# Patient Record
Sex: Male | Born: 1965 | Hispanic: No | Marital: Married | Smoking: Never smoker
Health system: Southern US, Community
[De-identification: ages and names within clinical notes are randomized; demographics above are authoritative.]

## PROBLEM LIST (undated history)

## (undated) DIAGNOSIS — K219 Gastro-esophageal reflux disease without esophagitis: Secondary | ICD-10-CM

## (undated) DIAGNOSIS — Z8619 Personal history of other infectious and parasitic diseases: Secondary | ICD-10-CM

## (undated) HISTORY — DX: Gastro-esophageal reflux disease without esophagitis: K21.9

## (undated) HISTORY — PX: ESOPHAGOGASTRODUODENOSCOPY: SHX1529

## (undated) HISTORY — DX: Personal history of other infectious and parasitic diseases: Z86.19

---

## 2003-06-06 ENCOUNTER — Emergency Department (HOSPITAL_COMMUNITY): Admission: EM | Admit: 2003-06-06 | Discharge: 2003-06-06 | Payer: Self-pay | Admitting: Emergency Medicine

## 2007-08-01 ENCOUNTER — Encounter: Admission: RE | Admit: 2007-08-01 | Discharge: 2007-08-01 | Payer: Self-pay | Admitting: Family Medicine

## 2007-08-30 ENCOUNTER — Ambulatory Visit: Payer: Self-pay | Admitting: Internal Medicine

## 2007-09-08 ENCOUNTER — Ambulatory Visit: Payer: Self-pay | Admitting: Internal Medicine

## 2007-09-08 ENCOUNTER — Encounter: Payer: Self-pay | Admitting: Internal Medicine

## 2007-10-10 ENCOUNTER — Telehealth: Payer: Self-pay | Admitting: Internal Medicine

## 2011-09-23 ENCOUNTER — Encounter: Payer: Self-pay | Admitting: Internal Medicine

## 2011-09-29 ENCOUNTER — Telehealth: Payer: Self-pay | Admitting: Internal Medicine

## 2011-09-29 ENCOUNTER — Ambulatory Visit (INDEPENDENT_AMBULATORY_CARE_PROVIDER_SITE_OTHER): Payer: BC Managed Care – PPO | Admitting: Internal Medicine

## 2011-09-29 ENCOUNTER — Encounter: Payer: Self-pay | Admitting: Internal Medicine

## 2011-09-29 VITALS — BP 124/72 | HR 68 | Ht 70.0 in | Wt 173.0 lb

## 2011-09-29 DIAGNOSIS — K219 Gastro-esophageal reflux disease without esophagitis: Secondary | ICD-10-CM

## 2011-09-29 NOTE — Patient Instructions (Addendum)
Please stay on your current regimen as it will take some time to get better.  Follow- up with [place] in six weeks, you may make that appointment on your way out.  Today you have been given a hand out on GERD and also correction of acid reflux by laparoscopy information.

## 2011-09-29 NOTE — Telephone Encounter (Signed)
Reflux and heartburn.  He will come in and see Dr. Leone Payor today at 11:00

## 2011-09-29 NOTE — Progress Notes (Signed)
Subjective:    Patient ID: Austin Clements, male    DOB: 12-09-1965, 46 y.o.   MRN: [id number]  HPI The patient presents because of her current acid reflux symptoms. 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 He was seen in 2009. At that time he had a normal esophagus but a gastritis that was H. pylori positive and he was treated with antibiotics. He seemed to improve. Over time he had recurrent heartburn and reflux symptoms though he managed with over-the-counter Prevacid or Prilosec. Things seem to worsen and he started taking Prevacid and Prilosec each morning. He noticed especially that he had problems with coffee or tea and red wine. Over the last month or so he said significantly increased symptoms of heartburn, including at night. He has reduced and/or eliminated the red wine and coffee repeat. He has moved to decaffeinated coffee but thinks that still bothersome. He was using 2-3 pillows to sleep at night to prevent nocturnal reflux. He has not had unintentional weight loss, dysphagia or bleeding. In the past week he saw the plaques physician, and was prescribed Protonix 40 mg twice a day, Carafate 1 g 4 times a day and a bland diet and over the past several days he is noted improvement. He does not have dysphagia but he does feel like his esophagus is raw or rough after eating.  No Known Allergies No outpatient prescriptions prior to visit.   Past Medical History  Diagnosis Date  . GERD (gastroesophageal reflux disease)   . History of Helicobacter pylori infection    Past Surgical History  Procedure Date  . Esophagogastroduodenoscopy 09-2007    H-pylori   History   Social History  . Marital Status: Married         Number of Children: 1  . Years of Education: PhD Chemist   Occupational History  . Scientist  [employer]   Social History Main Topics  . Smoking status: Never Smoker   . Smokeless tobacco: Never Used  . Alcohol Use: No  . Drug Use: No    Social History  Narrative   Daily caffeine    Family History  Problem Relation Age of Onset  . Colon cancer Neg Hx         Review of Systems As per history of present illness, all other review of systems negative    Objective:   Physical Exam General:  Well-developed, well-nourished and in no acute distress Eyes:  anicteric. ENT:   Mouth and posterior pharynx free of lesions.  Neck:   supple w/o thyromegaly or mass.  Lungs: Clear to auscultation bilaterally. Heart:  S1S2, no rubs, murmurs, gallops. Abdomen:  soft, non-tender, no hepatosplenomegaly, hernia, or mass and BS+.  Rectal: Lymph:  no cervical or supraclavicular adenopathy. Extremities:   no edema Skin   no rash. Neuro:  A&O x 3.  Psych:  appropriate mood and  Affect.           Assessment & Plan:   1. GERD (gastroesophageal reflux disease)    He is having a flare of acid reflux disease. It is possibly related to eradicate h pylori though he did well for a period of time after that it sounds like. He is modified his diet extensively. He is concerned about possible damage to the esophagus. I've explained it serious chronic damages unlikely given the normal looking esophagus in 2009. At this point I do not think endoscopy has any particular value as he seems to be improving.  I have recommended he continue to twice daily Protonix as well as the Carafate and see me in 6 weeks at which point we could reach group and modify his treatment regimen to determine if any investigative studies were needed. Handouts regarding acid reflux, laparoscopic fundoplication surgery for reflux, and Reflux diet are given to the patient today. Ultimately, reflux surgery is a possibility for this patient ON explained further testing with pH probe and manometry would be indicated prior to pursuing.  I appreciate the opportunity to care for this patient.   CC: [facility]

## 2011-09-30 ENCOUNTER — Telehealth: Payer: Self-pay

## 2011-09-30 NOTE — Telephone Encounter (Signed)
Office visit notes sent per Dr. Marvell Fuller request to pt's place of work, [employer], phone# [phone], fax# [phone].

## 2011-09-30 NOTE — Telephone Encounter (Signed)
Message copied by [place], Brigit Doke E on Wed Sep 30, 2011  8:45 AM ------      Message from: Iva Boop      Created: Tue Sep 29, 2011  5:27 PM      Regarding: cc to pcp       Please print and fax copy of encounter to [facility]

## 2011-10-15 ENCOUNTER — Ambulatory Visit: Payer: Self-pay | Admitting: Internal Medicine

## 2011-11-10 ENCOUNTER — Ambulatory Visit (INDEPENDENT_AMBULATORY_CARE_PROVIDER_SITE_OTHER): Payer: BC Managed Care – PPO | Admitting: Internal Medicine

## 2011-11-10 ENCOUNTER — Encounter: Payer: Self-pay | Admitting: Internal Medicine

## 2011-11-10 VITALS — BP 100/68 | HR 64 | Ht 69.25 in | Wt 171.4 lb

## 2011-11-10 DIAGNOSIS — F419 Anxiety disorder, unspecified: Secondary | ICD-10-CM

## 2011-11-10 DIAGNOSIS — K219 Gastro-esophageal reflux disease without esophagitis: Secondary | ICD-10-CM

## 2011-11-10 DIAGNOSIS — F411 Generalized anxiety disorder: Secondary | ICD-10-CM

## 2011-11-10 MED ORDER — SUCRALFATE 1 G PO TABS: 1.0000 g | ORAL_TABLET | Freq: Four times a day (QID) | ORAL | Status: DC

## 2011-11-10 NOTE — Patient Instructions (Addendum)
No allergy to soy or eggs.  We are giving you a handout on Barrett's Esophagus per your request.  Try and dissolve your Carafate pills in a liquid to make it easy to take.  We have sent a Rx for you to pick up at your pharmacy.  Continue to work on your diet and exercise to control your reflux symptoms.  Follow-up as needed with Dr. Leone Payor.  Thank you for choosing me and [facility].  Iva Boop, M.D., [facility]

## 2011-11-10 NOTE — Progress Notes (Signed)
Patient ID: Austin Clements, male   DOB: 1965/07/21, 46 y.o.   MRN: [id number]  The patient returns to discuss his reflux problems. He has noted that exercise and dietary restriction has helped quite a bit. He avoids tomato-based foods, caffeine chocolate. Just the twice daily course of Protonix. He is using Carafate slurry but notes that the slurries expensive and the tablets are cheaper. He says he has heartburn and sensitivity to swallowing these trigger foods with burning upon ingestion, off and on for many years, and that he has noted that stress and anxiety seem to make it worse. He reminded he is tried Prevacid, omeprazole, Protonix and I think Nexium as well and that is really did not make much difference over the years. He has nearly eliminated coffee although taking about 3 towards a cup a day. He has rare line, and he is limiting the other foods as mentioned.  He wonders about treating his stress, as he has a colleague who told him that Lexapro finally cured his heartburn problems. The patient is also concerned about the possibility of Barrett's esophagus, I reminded him that he did not have any in 2009.  Medications, allergies, past medical history, past surgical history, family history and social history are reviewed and updated in the EMR.   Assessment and plan:  1. GERD (gastroesophageal reflux disease)   2. Anxiety    He may really have more of a functional heartburn problem, and a visceral hypersensitivity problem. 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 and has some insomnia associated with this as well. The lack of response to PPI therapy goes against classic GERD though he could have weekly acidic her nonacidic reflux as Carafate does seem to help. I discussed options of treatment of the anxiety that would include buspirone, a tricyclic agent like amitriptyline and an SSRI.  1. Since he feels much better using an exercise and diet restriction  regimen, I've advised he continue that and consider these other therapies pending upon how that does. 2. Carafate tablets are prescribed, he should be able to dissolve these in water and make a slurry and save money that way. 3. He knows to call back if persistent problems and likely prescribe an anxiolytic type medication as described above or a tricyclic agent if desired.  I appreciate the opportunity to care for this patient.   CC: Charolett Bumpers, MD

## 2011-11-16 ENCOUNTER — Telehealth: Payer: Self-pay | Admitting: Internal Medicine

## 2011-11-16 NOTE — Telephone Encounter (Signed)
Patient advised I will discuss with Dr. Leone Payor when he returns to the office next week and we will call him with a response

## 2011-11-16 NOTE — Telephone Encounter (Signed)
Pt wants to try Aciphex because a friend said it worked well for them.  He is on Carafate and completed his Protonix.  Please advise since Dr. Shelle Iron on vacation this week.  Would like it sent to [facility] in [place] if ok'ed.  Thank you Sheri.

## 2011-11-18 MED ORDER — RABEPRAZOLE SODIUM 20 MG PO TBEC: DELAYED_RELEASE_TABLET | ORAL | Status: DC

## 2011-11-18 NOTE — Telephone Encounter (Signed)
Aciphex 20 mg qd 30 mins before breakfast #30 11 refills

## 2011-11-18 NOTE — Telephone Encounter (Signed)
I have left a message for the patient that we have called in Aciphex and asked him to call back for any concerns

## 2012-01-08 ENCOUNTER — Other Ambulatory Visit: Payer: Self-pay | Admitting: Emergency Medicine

## 2012-01-08 ENCOUNTER — Ambulatory Visit
Admission: RE | Admit: 2012-01-08 | Discharge: 2012-01-08 | Disposition: A | Discharge status: Home / Self Care | Payer: BC Managed Care – PPO | Source: Ambulatory Visit | Attending: Emergency Medicine | Admitting: Emergency Medicine

## 2012-01-08 ENCOUNTER — Telehealth: Payer: Self-pay | Admitting: Internal Medicine

## 2012-01-08 DIAGNOSIS — R059 Cough, unspecified: Secondary | ICD-10-CM

## 2012-01-08 DIAGNOSIS — R05 Cough: Secondary | ICD-10-CM

## 2012-01-08 MED ORDER — RABEPRAZOLE SODIUM 20 MG PO TBEC: DELAYED_RELEASE_TABLET | ORAL | Status: DC

## 2012-01-08 NOTE — Telephone Encounter (Signed)
Patient reports continued GERD symptoms despite daily PPI and carafate.  He is wondering what the next step.  He wants to know if you think a EGD is needed, please advise.  Your last office note discussed possible anxiolytics.  Please advise

## 2012-01-08 NOTE — Telephone Encounter (Signed)
I recommend a trial of amitriptylline 25 mg tabs - take 1/2 tab for first week Take at bedtime  #30, 2 refills Repeat visit 2 months  Other option is he can be referred for a possible impedance study at one of the tertiary centers - looks for reflux (acidic and non-acidic)

## 2012-01-08 NOTE — Telephone Encounter (Signed)
Patient advised.  He wants to do some research and call me back on Monday.

## 2012-01-12 NOTE — Telephone Encounter (Signed)
Left message for patient to call back  

## 2012-01-14 NOTE — Telephone Encounter (Signed)
Patient left me a voicemail that he wants to come in and talk about all his options before proceeding with either options.  He will call back and schedule an office visit at his convenience

## 2012-10-11 ENCOUNTER — Telehealth: Payer: Self-pay | Admitting: Internal Medicine

## 2012-10-11 NOTE — Telephone Encounter (Signed)
Ok to refill x 1 year 

## 2012-10-11 NOTE — Telephone Encounter (Signed)
May we refill the Carafate Sir?

## 2012-10-12 MED ORDER — SUCRALFATE 1 G PO TABS: 1.0000 g | ORAL_TABLET | Freq: Four times a day (QID) | ORAL | Status: DC

## 2012-10-13 ENCOUNTER — Telehealth: Payer: Self-pay | Admitting: Internal Medicine

## 2012-10-13 MED ORDER — SUCRALFATE 1 G PO TABS: 1.0000 g | ORAL_TABLET | Freq: Four times a day (QID) | ORAL | Status: DC

## 2012-10-13 NOTE — Telephone Encounter (Signed)
Phoned [facility] and changed generic Carafate to 3 month supply and 3 refills as patient requested, ok'ed by Dr. Leone Payor.  Patient informed.

## 2012-12-28 ENCOUNTER — Encounter: Payer: Self-pay | Admitting: Internal Medicine

## 2012-12-28 ENCOUNTER — Telehealth: Payer: Self-pay | Admitting: Internal Medicine

## 2012-12-28 ENCOUNTER — Ambulatory Visit (INDEPENDENT_AMBULATORY_CARE_PROVIDER_SITE_OTHER): Payer: BC Managed Care – PPO | Admitting: Internal Medicine

## 2012-12-28 VITALS — BP 128/84 | HR 68 | Ht 69.0 in | Wt 167.0 lb

## 2012-12-28 DIAGNOSIS — K219 Gastro-esophageal reflux disease without esophagitis: Secondary | ICD-10-CM

## 2012-12-28 DIAGNOSIS — R12 Heartburn: Secondary | ICD-10-CM

## 2012-12-28 DIAGNOSIS — R1310 Dysphagia, unspecified: Secondary | ICD-10-CM

## 2012-12-28 NOTE — Patient Instructions (Addendum)
You have been scheduled for an endoscopy with propofol. Please follow written instructions given to you at your visit today. If you use inhalers (even only as needed), please bring them with you on the day of your procedure. Your physician has requested that you go to www.startemmi.com and enter the access code given to you at your visit today. This web site gives a general overview about your procedure. However, you should still follow specific instructions given to you by our office regarding your preparation for the procedure.  Today you are being given Dexilant samples , take one 30 minutes prior to breakfast daily.  This is to replace your Aciphex.   Dexilant samples lot# [id number], exp. 08/2015  # 15   I appreciate the opportunity to care for you.

## 2012-12-28 NOTE — Telephone Encounter (Signed)
Patient having worsening reflux and dysphagia He will come in today and see Dr. Leone Payor today at 11:30

## 2012-12-29 NOTE — Progress Notes (Signed)
  Subjective:    Patient ID: Austin Clements, male    DOB: 11/16/1965, 47 y.o.   MRN: [id number]  HPI The patient is a very pleasant middle-aged [place] man with a long history of heartburn and reflux-like symptoms. He has been on multiple PPI treatments he is also been on Carafate. He cannot get adequate persistent relief of his symptoms, only some mild improvement at times. He returns today stating that the current regimen of rabeprazole and Carafate is not helping. There may be some mild dysphagia-like symptoms as well. Last EGD in 2009 was unrevealing. He did have some gastritis that was nonerosive. He is frustrated by the persistent symptoms, he is especially bothered if he drinks red wine, or coffee. He has basically eliminated most caffeinated beverages and alcohol such as red wine. However he does have it sometimes.  Medications, allergies, past medical history, past surgical history, family history and social history are reviewed and updated in the EMR.   Review of Systems As above    Objective:   Physical Exam Well-developed well-nourished Asian man in no acute distress Lungs clear Heart S1-S2 no rubs or gallops    Assessment & Plan:   1. Heartburn   2. Dysphagia, unspecified(787.20)   3. Esophageal reflux    1. I explained to him that he has GERD-like symptoms but does not respond to typical treatment. This may be a functional heartburn functional dyspepsia-like process. 2. Given that the last EGD was in 2009 is having some dysphagia, I think it's reasonable to repeat an EGD with plans to biopsy the esophagus to rule out eosinophilic esophagitis, and also possibly dilate the esophagus. 3. In the meantime a trial of Dexilant 60 mg daily is given. 4. He could need impedance testing of the esophagus. I explained that that would probably be unlikely the source of his problems, and another option would be some empiric treatment with something such as a low dose tricyclic agent. We will  make a decision on this pending findings at the EGD and followup of response to Dexilant.  I appreciate the opportunity to care for this patient. CC: Charolett Bumpers, MD

## 2012-12-30 ENCOUNTER — Ambulatory Visit (AMBULATORY_SURGERY_CENTER): Payer: BC Managed Care – PPO | Admitting: Internal Medicine

## 2012-12-30 ENCOUNTER — Encounter: Payer: Self-pay | Admitting: Internal Medicine

## 2012-12-30 VITALS — BP 126/86 | HR 72 | Temp 97.7°F | Resp 18 | Ht 69.0 in | Wt 167.0 lb

## 2012-12-30 DIAGNOSIS — K219 Gastro-esophageal reflux disease without esophagitis: Secondary | ICD-10-CM

## 2012-12-30 DIAGNOSIS — D130 Benign neoplasm of esophagus: Secondary | ICD-10-CM

## 2012-12-30 DIAGNOSIS — D131 Benign neoplasm of stomach: Secondary | ICD-10-CM

## 2012-12-30 MED ORDER — SODIUM CHLORIDE 0.9 % IV SOLN: 500.0000 mL | INTRAVENOUS | Status: DC

## 2012-12-30 NOTE — Progress Notes (Signed)
Called to room to assist during endoscopic procedure.  Patient ID and intended procedure confirmed with present staff. Received instructions for my participation in the procedure from the performing physician.  

## 2012-12-30 NOTE — Op Note (Signed)
[facility] [address] [place], [address]   ENDOSCOPY PROCEDURE REPORT  PATIENT: Austin Clements, Austin Clements  MR#: [id number] BIRTHDATE: [birth year] , 47  yrs. old GENDER: Male ENDOSCOPIST: Iva Boop, MD, [facility] PROCEDURE DATE:  12/30/2012 PROCEDURE:  EGD w/ biopsy ASA CLASS:     Class I INDICATIONS:  Heartburn.   Follow up of esophageal reflux. MEDICATIONS: propofol (Diprivan) 250mg  IV, MAC sedation, administered by CRNA, and These medications were titrated to patient response per physician's verbal order TOPICAL ANESTHETIC: Cetacaine Spray  DESCRIPTION OF PROCEDURE: After the risks benefits and alternatives of the procedure were thoroughly explained, informed consent was obtained.  The LB [id number] endoscope was introduced through the mouth and advanced to the second portion of the duodenum. Without limitations.  The instrument was slowly withdrawn as the mucosa was fully examined.      The upper, middle and distal third of the esophagus were carefully inspected and no abnormalities were noted.  The z-line was well seen at the GEJ.  The endoscope was pushed into the fundus which was normal including a retroflexed view.  The antrum, gastric body, first and second part of the duodenum were unremarkable.  Multiple biopsies were performed of the stomach (body and antrum) and esophagus (proximal, mid and distal).  Retroflexed views revealed no abnormalities.     The scope was then withdrawn from the patient and the procedure completed.  COMPLICATIONS: There were no complications. ENDOSCOPIC IMPRESSION: Normal EGD; multiple biopsies taken of esophagus and stomach - ? eosinophilic esophagitis/gastritis  RECOMMENDATIONS: 1.  Await pathology results 2.  continue PPI - Dexilant 60 mg  REPEAT EXAM:  eSigned:  Iva Boop, MD, [facility] 12/30/2012 3:12 PM   CC:The Patient

## 2012-12-30 NOTE — Progress Notes (Signed)
Patient did not experience any of the following events: a burn prior to discharge; a fall within the facility; wrong site/side/patient/procedure/implant event; or a hospital transfer or hospital admission upon discharge from the facility. (G8907)Patient did not have preoperative order for IV antibiotic SSI prophylaxis. (G8918) ewm 

## 2012-12-30 NOTE — Patient Instructions (Addendum)
I took biopsies of the esophagus and stomach today and will contact you with the results and recommendations.  I did not dilate the esophagus since I did not see any narrow areas or obstructions (strictures).  I appreciate the opportunity to care for you. Iva Boop, MD, FACG  YOU HAD AN ENDOSCOPIC PROCEDURE TODAY AT THE [facility]: Refer to the procedure report that was given to you for any specific questions about what was found during the examination.  If the procedure report does not answer your questions, please call your gastroenterologist to clarify.  If you requested that your care partner not be given the details of your procedure findings, then the procedure report has been included in a sealed envelope for you to review at your convenience later.  YOU SHOULD EXPECT: Some feelings of bloating in the abdomen. Passage of more gas than usual.  Walking can help get rid of the air that was put into your GI tract during the procedure and reduce the bloating. If you had a lower endoscopy (such as a colonoscopy or flexible sigmoidoscopy) you may notice spotting of blood in your stool or on the toilet paper. If you underwent a bowel prep for your procedure, then you may not have a normal bowel movement for a few days.  DIET: Your first meal following the procedure should be a light meal and then it is ok to progress to your normal diet.  A half-sandwich or bowl of soup is an example of a good first meal.  Heavy or fried foods are harder to digest and may make you feel nauseous or bloated.  Likewise meals heavy in dairy and vegetables can cause extra gas to form and this can also increase the bloating.  Drink plenty of fluids but you should avoid alcoholic beverages for 24 hours.  ACTIVITY: Your care partner should take you home directly after the procedure.  You should plan to take it easy, moving slowly for the rest of the day.  You can resume normal activity the day after the  procedure however you should NOT DRIVE or use heavy machinery for 24 hours (because of the sedation medicines used during the test).    SYMPTOMS TO REPORT IMMEDIATELY: A gastroenterologist can be reached at any hour.  During normal business hours, 8:30 AM to 5:00 PM Monday through Friday, call [phone].  After hours and on weekends, please call the GI answering service at [phone] emergency number who will take a message and have the physician on call contact you.   Following upper endoscopy (EGD)  Vomiting of blood or coffee ground material  New chest pain or pain under the shoulder blades  Painful or persistently difficult swallowing  New shortness of breath  Fever of 100F or higher  Black, tarry-looking stools  FOLLOW UP: If any biopsies were taken you will be contacted by phone or by letter within the next 1-3 weeks.  Call your gastroenterologist if you have not heard about the biopsies in 3 weeks.  Our staff will call the home number listed on your records the next business day following your procedure to check on you and address any questions or concerns that you may have at that time regarding the information given to you following your procedure. This is a courtesy call and so if there is no answer at the home number and we have not heard from you through the emergency physician on call, we will assume that you have  returned to your regular daily activities without incident.  SIGNATURES/CONFIDENTIALITY: You and/or your care partner have signed paperwork which will be entered into your electronic medical record.  These signatures attest to the fact that that the information above on your After Visit Summary has been reviewed and is understood.  Full responsibility of the confidentiality of this discharge information lies with you and/or your care-partner.  Continue dexilant 60 mg as directed. This medicine works best if taken 20-30 minutes before the first meal of the day

## 2012-12-30 NOTE — Progress Notes (Signed)
Procedure ends, to recovery, report given and VSS. 

## 2013-01-02 ENCOUNTER — Encounter: Payer: Self-pay | Admitting: Internal Medicine

## 2013-01-02 ENCOUNTER — Telehealth: Payer: Self-pay

## 2013-01-02 NOTE — Telephone Encounter (Signed)
  Follow up Call-  Call back number 12/30/2012  Post procedure Call Back phone  # [phone]  Permission to leave phone message Yes     Patient questions:  Do you have a fever, pain , or abdominal swelling? no Pain Score  0 *  Have you tolerated food without any problems? yes  Have you been able to return to your normal activities? yes  Do you have any questions about your discharge instructions: Diet   no Medications  no Follow up visit  no  Do you have questions or concerns about your Care? no  Actions: * If pain score is 4 or above: No action needed, pain <4.  Per the pt he said he feels some "discomfort" in his throat when he swallows.  He swallows ok, but he states, "it is not a pain but discomfort is felt".  I advised him if this continues few more days and does not stop, to call us back.  Pt did not c/o any other sx.  Pt said he would call back if it continues. Maw

## 2013-01-05 NOTE — Progress Notes (Signed)
Quick Note:  Biopsies are normal (esophagus and stomach)  How is he doing on the dexilant?  No letter or recall from [facility] ______

## 2013-01-27 ENCOUNTER — Encounter: Payer: Self-pay | Admitting: Gastroenterology

## 2013-02-02 ENCOUNTER — Ambulatory Visit (INDEPENDENT_AMBULATORY_CARE_PROVIDER_SITE_OTHER): Payer: BC Managed Care – PPO | Admitting: Internal Medicine

## 2013-02-02 ENCOUNTER — Encounter: Payer: Self-pay | Admitting: Internal Medicine

## 2013-02-02 VITALS — BP 110/74 | HR 60 | Ht 69.0 in | Wt 167.2 lb

## 2013-02-02 DIAGNOSIS — K219 Gastro-esophageal reflux disease without esophagitis: Secondary | ICD-10-CM

## 2013-02-02 DIAGNOSIS — R5383 Other fatigue: Secondary | ICD-10-CM | POA: Insufficient documentation

## 2013-02-02 DIAGNOSIS — R0683 Snoring: Secondary | ICD-10-CM | POA: Insufficient documentation

## 2013-02-02 DIAGNOSIS — R0609 Other forms of dyspnea: Secondary | ICD-10-CM

## 2013-02-02 DIAGNOSIS — R5381 Other malaise: Secondary | ICD-10-CM

## 2013-02-02 NOTE — Assessment & Plan Note (Addendum)
Abut the same - ? If mostly functional heartburn. He will stay on PPI. He thinks Gaviscon helps and keep him from awakening at night. He certainly could have weakly acidic or non-acid reflux. Functional heartburn also possible. We discussed possible using tricyclic agents, SSRI or additional testing with impedance manometry or even a tertiary referral. Additionally he wonders about sleep apnea, he is a heavy snorer apparently in I suppose it is possible that positive pressure ventilation could help some of his problems as well. If he has sleep apnea and in some of his symptoms of feeling unwell could be related to that and certainly would improve with treatment. Off for a sleep referral. He wants to defer because of his pending move. He will contact me when he is ready to do this. He does not wish to take SSRI therapy at this time. He will consider for the future.

## 2013-02-02 NOTE — Progress Notes (Signed)
  Subjective:    Patient ID: Austin Clements, male    DOB: May 26, 1965, 47 y.o.   MRN: [id number]  HPI Patient is here for followup after had an upper endoscopy. Multiple biopsies were taken due to persistent GI complaints mainly heartburn-type issues. The esophageal and gastric biopsies were negative. He has been on Dexilant 60 mg initially noted some improvement but continues to have problems of heartburn. He had congestion in her cough and saw the doctor recently and was told that maybe this cough was reflux. He relates that he snores a lot, and he has a friend and colleague who had sleep apnea treated with positive pressure ventilation and said that his reflux got much better. He wonders if that could be an issue for him. He also does not feel fresh when he awakens in the morning and feels fatigued. Another colleague and/or friend to treatment with an SSRI, he thinks it was Celexa, and said that his heartburn went away on that. He wonders if that might be helpful. We discussed the possibility of tricyclic agents, further testing with impedance probes or referral to a tertiary center for his GERD symptoms that have been relatively refractory to therapy. Medications, allergies, past medical history, past surgical history, family history and social history are reviewed and updated in the EMR.   Review of Systems As above, [place] taking a job with [employer] [place] He will continue to commute and work here at [employer].    Objective:   Physical Exam Well-developed healthy-appearing [place] Asian man in no acute distress       Assessment & Plan:   1. GERD (gastroesophageal reflux disease)   2. Snoring   3. Fatigue

## 2013-02-02 NOTE — Patient Instructions (Signed)
Good luck with your move.  Once you get settled in give [place] a call back when you would like for [place] to set up an appointment for a consult with Dr. Coralyn Helling for sleep issues your having.  Follow up with [place] as needed.  I appreciate the opportunity to care for you.

## 2013-02-02 NOTE — Assessment & Plan Note (Signed)
He says his wife reports that he snores a lot. With his other symptoms including fatigue, occasional mild headaches I suppose he could have sleep apnea. I offered to refer him, to Dr. Craige Cotta. He will call me when he is ready, he is in the process of moving.

## 2013-11-28 IMAGING — CR DG CHEST 2V
3 series · 3 of 3 positions shown · non-contrast
Comparison: None.

CLINICAL DATA: Dry cough for 1 month

CHEST - 2 VIEW

[view not recorded (1 of 3)]
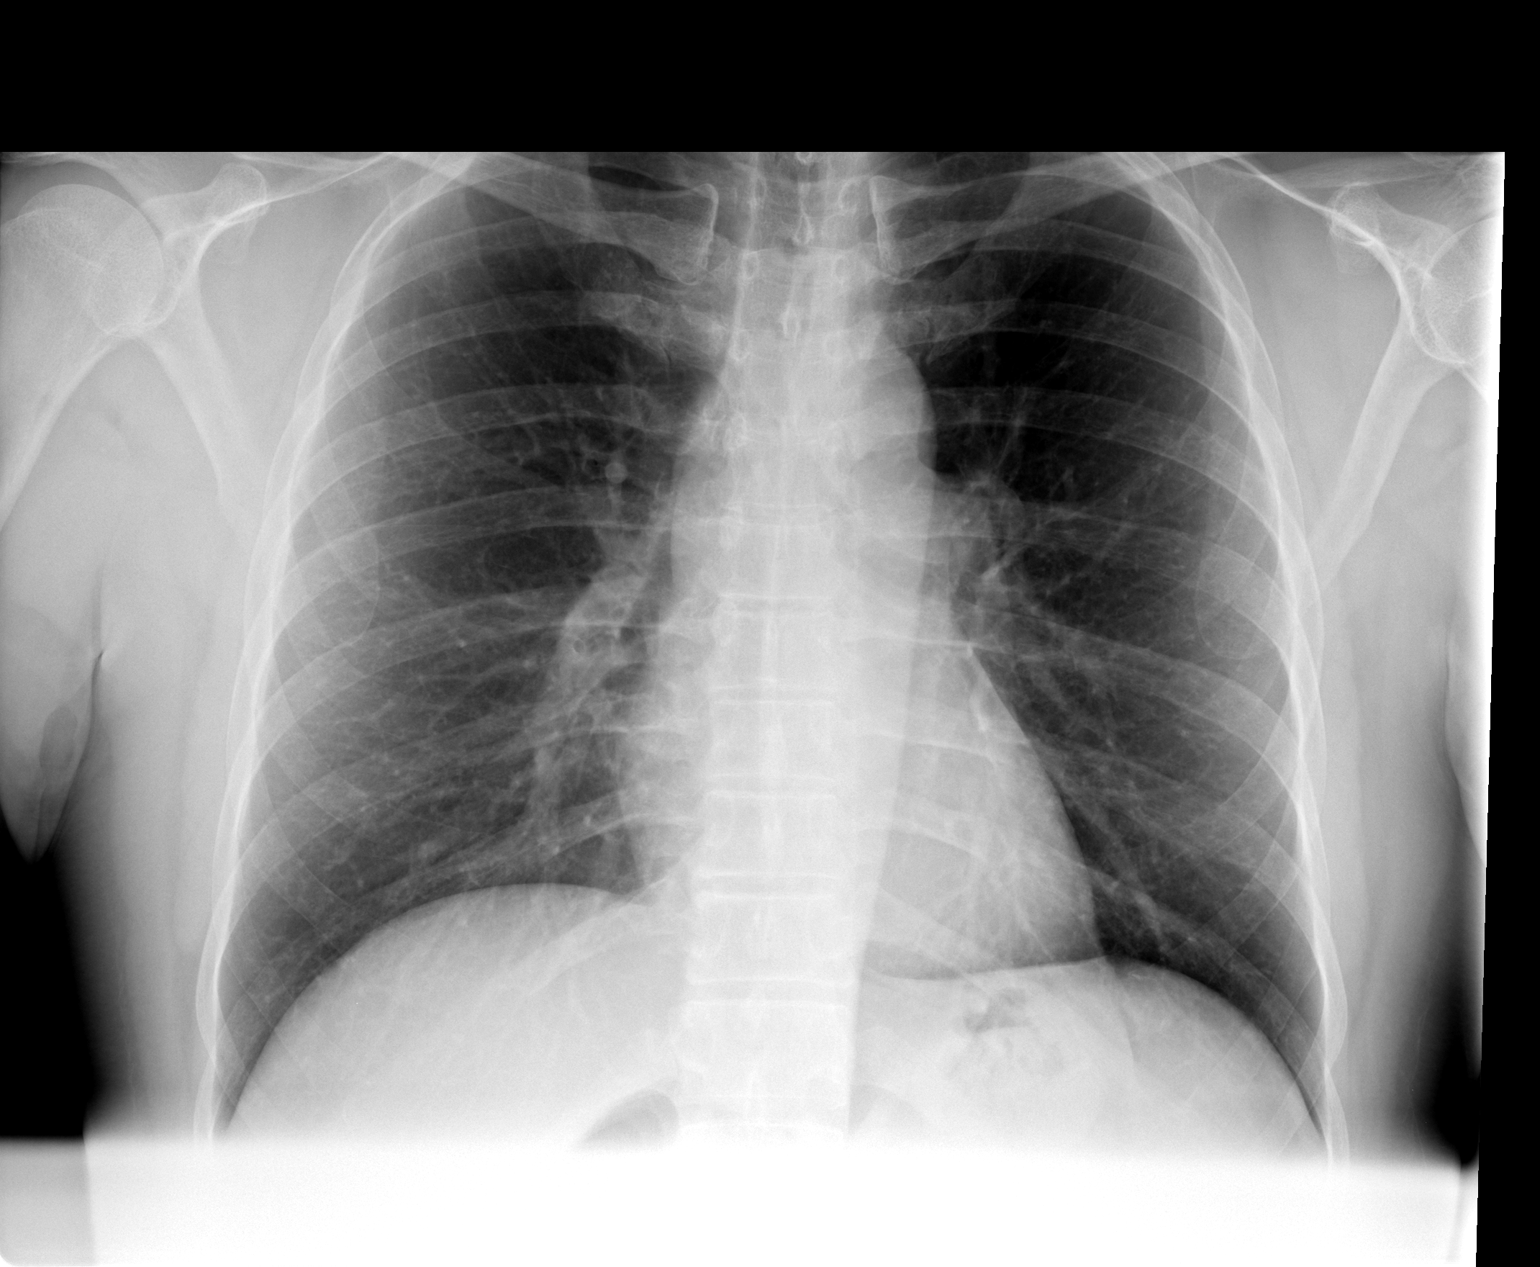

[view not recorded (2 of 3)]
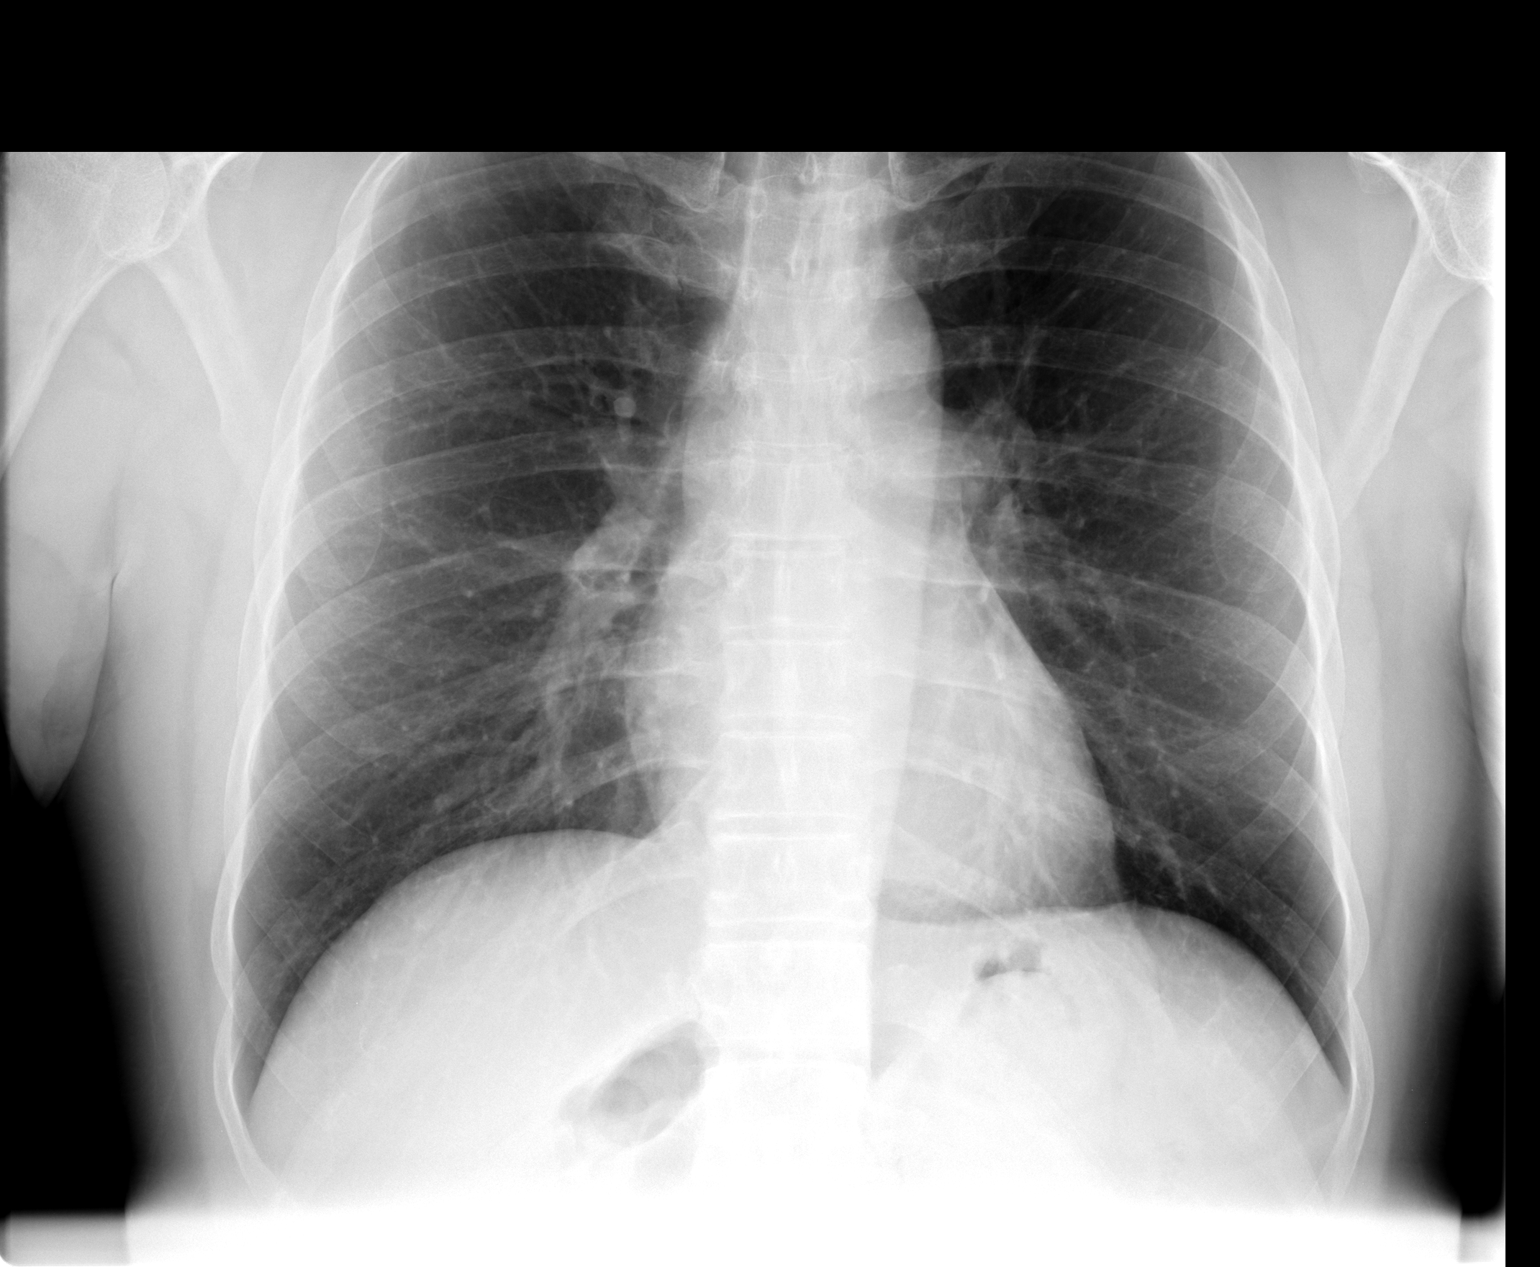

[view not recorded (3 of 3)]
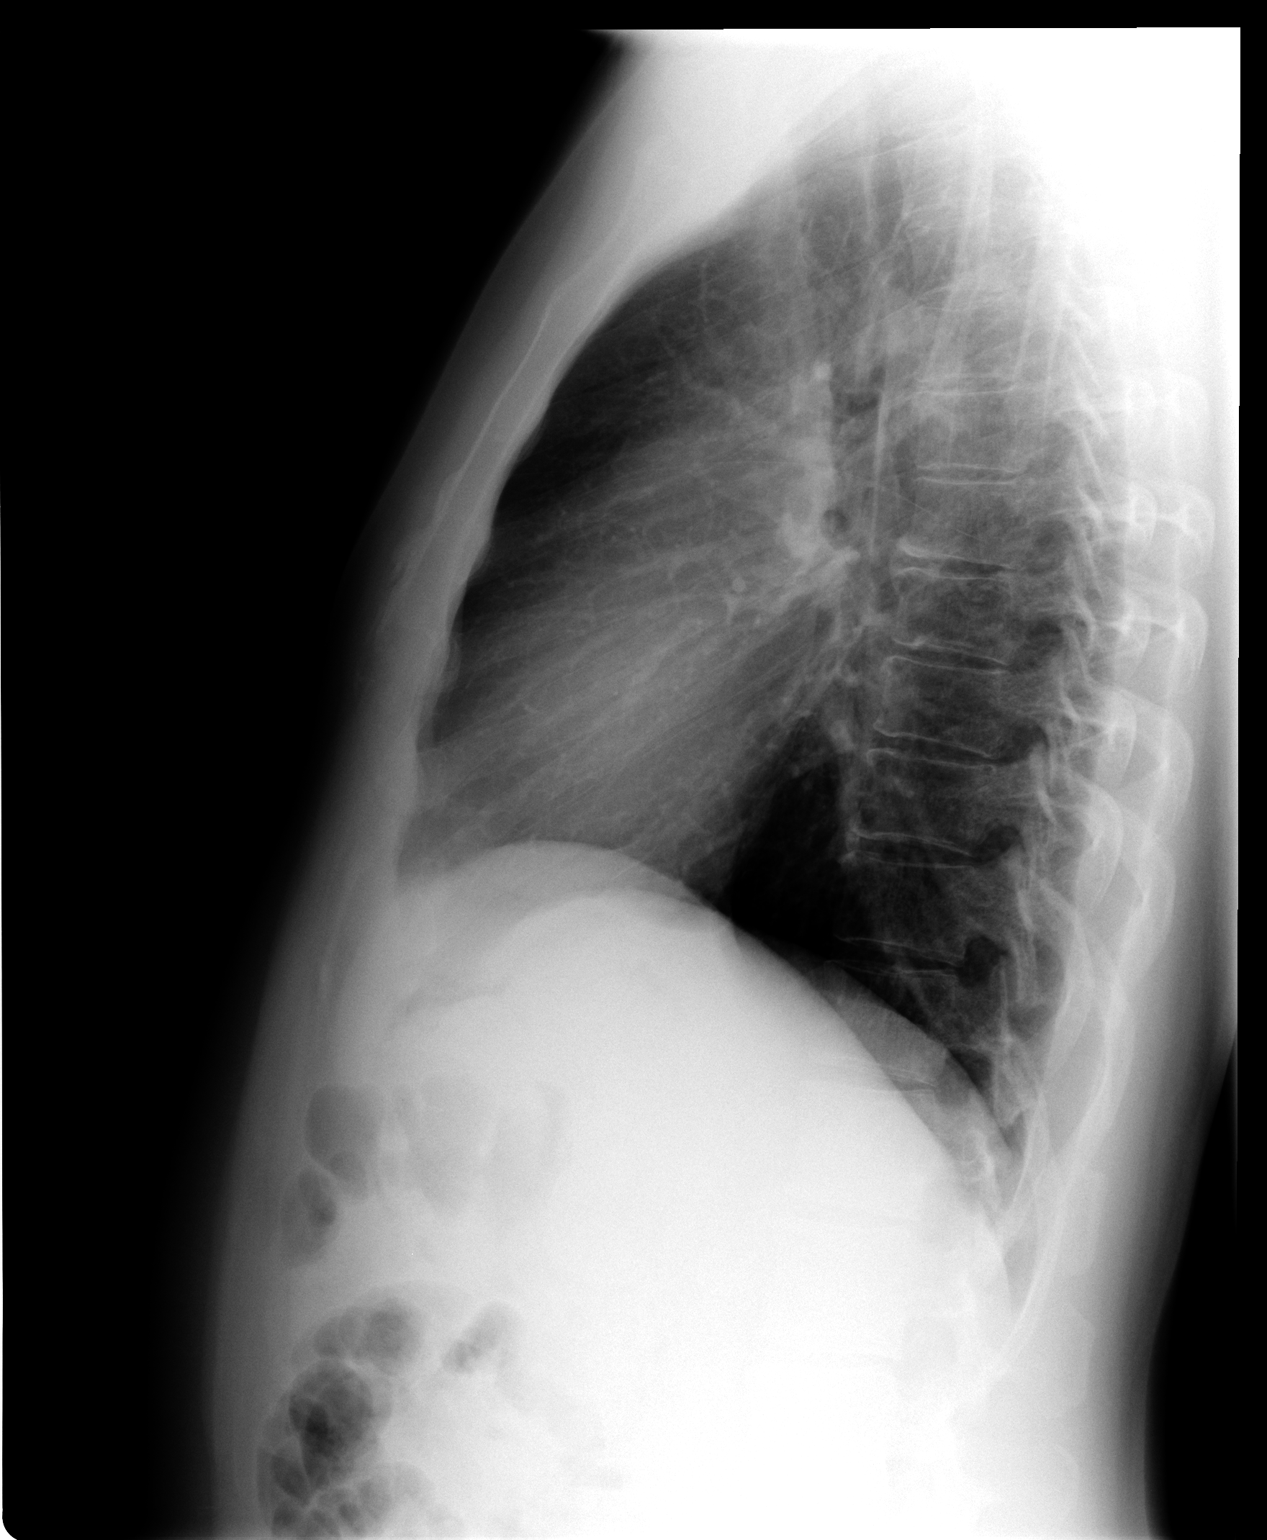

[3 of 3 positions shown; findings below may reference images not displayed]

FINDINGS: No active infiltrate or effusion is seen.  Mediastinal
contours appear normal.  The heart is within normal limits in size.
No bony abnormality is seen.
IMPRESSION: No active lung disease.

## 2014-04-11 ENCOUNTER — Ambulatory Visit (HOSPITAL_BASED_OUTPATIENT_CLINIC_OR_DEPARTMENT_OTHER): Payer: BC Managed Care – PPO | Attending: Emergency Medicine | Admitting: Radiology

## 2014-04-11 VITALS — Ht 70.0 in | Wt 169.0 lb

## 2014-04-11 DIAGNOSIS — G4733 Obstructive sleep apnea (adult) (pediatric): Secondary | ICD-10-CM

## 2014-04-11 DIAGNOSIS — R0683 Snoring: Secondary | ICD-10-CM | POA: Insufficient documentation

## 2014-04-11 DIAGNOSIS — G4710 Hypersomnia, unspecified: Secondary | ICD-10-CM | POA: Diagnosis present

## 2014-04-14 DIAGNOSIS — G4733 Obstructive sleep apnea (adult) (pediatric): Secondary | ICD-10-CM

## 2014-04-14 NOTE — Sleep Study (Signed)
   NAME: Austin Clements DATE OF BIRTH:  07-22-1965 MEDICAL RECORD NUMBER [id number]  LOCATION: [facility]  PHYSICIAN: Manish Ruggiero D  DATE OF STUDY: 04/11/2014  SLEEP STUDY TYPE: Nocturnal Polysomnogram               REFERRING PHYSICIAN: Harden Mo, MD  INDICATION FOR STUDY: Hypersomnia with sleep apnea  EPWORTH SLEEPINESS SCORE:   8/24 HEIGHT: 5\' 10"  (177.8 cm)  WEIGHT: 76.658 kg (169 lb)    Body mass index is 24.25 kg/(m^2).  NECK SIZE: 14 in.  MEDICATIONS: Charted for review  SLEEP ARCHITECTURE: Total sleep time 305.5 minutes with sleep efficiency 85.1%. Stage I was 7%, stage II 65.3%, stage III absent, REM 27.7% of total sleep time. Sleep latency 21 minutes, REM latency 72.5 minutes, awake after sleep onset 35 minutes, arousal index 12.2, bedtime medication: Dexilant  RESPIRATORY DATA: Apnea hypopnea index (AHI) 6.9 per hour. 35 total events scored including 12 obstructive apneas, 1 mixed apnea, 22 hypopneas. Events were nonsupine. REM AHI 22 per hour.  OXYGEN DATA: Occasional moderate snoring with oxygen desaturation to a nadir of 82% and mean saturation 95.4% on room air  CARDIAC DATA: Sinus rhythm with PVCs  MOVEMENT/PARASOMNIA: No significant movement disturbance, bathroom 1  IMPRESSION/ RECOMMENDATION:   1) Mild obstructive sleep apnea/hypopnea syndrome, AHI 6.9 per hour with only supine events. REM AHI 22 per hour. Occasional moderate snoring with oxygen desaturation to a nadir of 82% and mean saturation 95.4% on room air. 2) There were not enough events to permit application of split protocol CPAP titration on this study. Scores in this range may not need to be treated (the upper limit of normal is an AHI of 5 per hour). In some cases encouragement to sleep off flat of back, weight loss or treatment for nasal congestion may be sufficient. On an individual basis, CPAP or an oral appliance might be considered.   Deneise Lever Diplomate, American  Board of Sleep Medicine  ELECTRONICALLY SIGNED ON:  04/14/2014, 2:59 PM [place] PH: [phone]   FX: [phone] [place]

## 2014-06-12 ENCOUNTER — Ambulatory Visit (HOSPITAL_BASED_OUTPATIENT_CLINIC_OR_DEPARTMENT_OTHER): Payer: BC Managed Care – PPO

## 2014-07-30 ENCOUNTER — Telehealth: Payer: Self-pay | Admitting: Internal Medicine

## 2014-07-30 MED ORDER — DEXLANSOPRAZOLE 60 MG PO CPDR: 60.0000 mg | DELAYED_RELEASE_CAPSULE | Freq: Every day | ORAL | Status: AC

## 2014-07-30 NOTE — Telephone Encounter (Signed)
Spoke with patient , confirmed pharmacy and amount reqested.  Seen Oct 2014 so sent in refill.

## 2015-01-03 ENCOUNTER — Encounter: Payer: Self-pay | Admitting: Internal Medicine

## 2015-05-08 ENCOUNTER — Other Ambulatory Visit: Payer: Self-pay | Admitting: Internal Medicine

## 2015-12-30 ENCOUNTER — Telehealth: Payer: Self-pay | Admitting: Internal Medicine

## 2015-12-31 NOTE — Telephone Encounter (Signed)
Patient calling in regarding this.  °

## 2015-12-31 NOTE — Telephone Encounter (Signed)
Patient encouraged to come in and see PA before appt in November for Dr. Carlean Purl he declines.  He will come in on 03/03/16.  He is advised I will add him to the cancellation list.

## 2016-03-03 ENCOUNTER — Ambulatory Visit: Payer: BC Managed Care – PPO | Admitting: Internal Medicine

## 2016-04-30 ENCOUNTER — Ambulatory Visit (INDEPENDENT_AMBULATORY_CARE_PROVIDER_SITE_OTHER): Payer: BLUE CROSS/BLUE SHIELD | Admitting: Internal Medicine

## 2016-04-30 ENCOUNTER — Encounter (INDEPENDENT_AMBULATORY_CARE_PROVIDER_SITE_OTHER): Payer: Self-pay

## 2016-04-30 ENCOUNTER — Encounter: Payer: Self-pay | Admitting: Internal Medicine

## 2016-04-30 VITALS — BP 110/86 | HR 60 | Ht 69.0 in | Wt 180.1 lb

## 2016-04-30 DIAGNOSIS — K219 Gastro-esophageal reflux disease without esophagitis: Secondary | ICD-10-CM | POA: Diagnosis not present

## 2016-04-30 NOTE — Progress Notes (Signed)
   Austin Clements 51 y.o. [birth year] [id number]  Assessment & Plan:   Encounter Diagnosis  Name Primary?  . Gastroesophageal reflux disease without esophagitis Yes    We reviewed nature and pathophysiolgy of GERD. He does not seem to respond well to PPI's r H2B's. ? Functional though hx of sxs w/ red wine, spicy foods and relief w/ Gaviscon (partial) suggests otherwise. He has not been interested in functional evaluation and pH studies in past. He would be interested in clinical trial for other therapies - medical vs surgical/procedure pehaps.  I told him it was very unlikely that he would have significant health problems from his GERD - 2 negative EGD's 2009 and 2014. He has some concern/anxiety over this possibility.I get the sense he would like to eliminate his sxs though he says he can tolerate them.  Weight gain could be contributing to problems also.  I will refer him to esophagologist at [facility]:   Chief Complaint: will my GERD harm me ?  HPI 51 you Asian man here - f/u - last seen 2014 - had negative EGD then and also 2009 - has heartburn sxs poorly responsive to PPI, and H2 blocker. Has frequent nocturnal sxs unless he takes Gaviscon at bedtime. 2-3 hrs from ;ast meal to bed , spicy foods and red wine aggravate. Some caffeine, not much. No tobaccoGaviscon helps but still can awaken in early Am and then will take Dexilant - using prn. During day drinks water which he thinks reduces/eliminates heartburn.   Wt Readings from Last 3 Encounters:  04/30/16 180 lb 2 oz (81.7 kg)  04/11/14 169 lb (76.7 kg)  02/02/13 167 lb 4 oz (75.9 kg)   Current Meds  Medication Sig  . Alum Hydroxide-Mag Carbonate (GAVISCON PO) Take by mouth as needed.   Marland Kitchen dexlansoprazole (DEXILANT) 60 MG capsule Take 1 capsule (60 mg total) by mouth daily.  No Known Allergies Past Medical History:  Diagnosis Date  . GERD (gastroesophageal reflux disease)   . History of Helicobacter pylori  infection    Past Surgical History:  Procedure Laterality Date  . ESOPHAGOGASTRODUODENOSCOPY  09-2007, 12-2012   H-pylori   Social History   Social History  . Marital status: Married    Spouse name: N/A  . Number of children: 1  . Years of education: N/A   Occupational History  . Scientist  [employer]   Social History Main Topics  . Smoking status: Never Smoker  . Smokeless tobacco: Never Used  . Alcohol use No  . Drug use: No  . Sexual activity: Not Asked   Other Topics Concern  . None   Social History Narrative   Daily caffeine    Family History  Problem Relation Age of Onset  . Colon cancer Neg Hx      Review of Systems As above, some weight gain,   Objective:   Physical Exam BP 110/86   Pulse 60   Ht 5\' 9"  (1.753 m)   Wt 180 lb 2 oz (81.7 kg)   BMI 26.60 kg/m  NAD Eyes anicteric Alert and oriented x 3 Appropriate mood/affect  20 minutes time spent with patient > half in counseling coordination of care

## 2016-04-30 NOTE — Addendum Note (Signed)
Addended by: Gatha Mayer on: 04/30/2016 03:45 PM   Modules accepted: Level of Service

## 2016-04-30 NOTE — Patient Instructions (Signed)
   Continue your lifestyle changes.    Make sure and wait three hours between eating and going to bed.    Consider taking over the counter Zegerid at bedtime with your Gaviscon.     We are going to be in touch about a referral to [facility].     I appreciate the opportunity to care for you. Silvano Rusk, MD, [facility]

## 2016-06-09 NOTE — Progress Notes (Signed)
Patient has been scheduled for consult with [facility] [place]en for 06/29/16 12:15

## 2023-05-25 ENCOUNTER — Other Ambulatory Visit: Payer: Self-pay | Admitting: Family Medicine

## 2023-05-25 DIAGNOSIS — R0789 Other chest pain: Secondary | ICD-10-CM

## 2023-05-25 NOTE — Progress Notes (Unsigned)
 {  Select_TRH_[id number]}

## 2023-06-17 ENCOUNTER — Ambulatory Visit (HOSPITAL_BASED_OUTPATIENT_CLINIC_OR_DEPARTMENT_OTHER)
Admission: RE | Admit: 2023-06-17 | Discharge: 2023-06-17 | Disposition: A | Discharge status: Home / Self Care | Payer: Self-pay | Source: Ambulatory Visit | Attending: Family Medicine | Admitting: Family Medicine

## 2023-06-17 DIAGNOSIS — R0789 Other chest pain: Secondary | ICD-10-CM | POA: Insufficient documentation
